# Patient Record
Sex: Female | Born: 1935 | Race: White | Hispanic: No
Health system: Southern US, Community
[De-identification: ages and names within clinical notes are randomized; demographics above are authoritative.]

---

## 1997-05-21 ENCOUNTER — Encounter: Admission: RE | Admit: 1997-05-21 | Discharge: 1997-05-21 | Payer: Self-pay | Admitting: Internal Medicine

## 1998-03-24 ENCOUNTER — Other Ambulatory Visit: Admission: RE | Admit: 1998-03-24 | Discharge: 1998-03-24 | Payer: Self-pay | Admitting: Oncology

## 1998-07-07 ENCOUNTER — Encounter: Admission: RE | Admit: 1998-07-07 | Discharge: 1998-07-07 | Payer: Self-pay | Admitting: Internal Medicine

## 1998-07-07 ENCOUNTER — Encounter: Payer: Self-pay | Admitting: Oncology

## 1998-07-11 ENCOUNTER — Ambulatory Visit (HOSPITAL_COMMUNITY): Admission: RE | Admit: 1998-07-11 | Discharge: 1998-07-11 | Payer: Self-pay | Admitting: Internal Medicine

## 1999-03-27 ENCOUNTER — Encounter: Admission: RE | Admit: 1999-03-27 | Discharge: 1999-03-27 | Payer: Self-pay | Admitting: Oncology

## 1999-03-27 ENCOUNTER — Encounter: Payer: Self-pay | Admitting: Oncology

## 2000-04-18 ENCOUNTER — Encounter: Payer: Self-pay | Admitting: Oncology

## 2000-04-18 ENCOUNTER — Encounter: Admission: RE | Admit: 2000-04-18 | Discharge: 2000-04-18 | Payer: Self-pay | Admitting: Oncology

## 2001-04-24 ENCOUNTER — Encounter: Payer: Self-pay | Admitting: Oncology

## 2001-04-24 ENCOUNTER — Encounter: Admission: RE | Admit: 2001-04-24 | Discharge: 2001-04-24 | Payer: Self-pay | Admitting: Oncology

## 2001-10-18 ENCOUNTER — Encounter: Payer: Self-pay | Admitting: Emergency Medicine

## 2001-10-18 ENCOUNTER — Emergency Department (HOSPITAL_COMMUNITY): Admission: EM | Admit: 2001-10-18 | Discharge: 2001-10-19 | Payer: Self-pay | Admitting: Emergency Medicine

## 2002-05-04 ENCOUNTER — Encounter: Admission: RE | Admit: 2002-05-04 | Discharge: 2002-05-04 | Payer: Self-pay | Admitting: Oncology

## 2002-05-04 ENCOUNTER — Encounter: Payer: Self-pay | Admitting: Oncology

## 2002-08-29 ENCOUNTER — Encounter: Payer: Self-pay | Admitting: Internal Medicine

## 2002-08-29 ENCOUNTER — Ambulatory Visit (HOSPITAL_COMMUNITY): Admission: RE | Admit: 2002-08-29 | Discharge: 2002-08-29 | Payer: Self-pay | Admitting: Internal Medicine

## 2002-11-15 ENCOUNTER — Encounter: Admission: RE | Admit: 2002-11-15 | Discharge: 2003-01-02 | Payer: Self-pay | Admitting: Neurology

## 2003-05-17 ENCOUNTER — Encounter: Admission: RE | Admit: 2003-05-17 | Discharge: 2003-05-17 | Payer: Self-pay | Admitting: Oncology

## 2003-12-27 ENCOUNTER — Ambulatory Visit: Payer: Self-pay | Admitting: Oncology

## 2004-05-18 ENCOUNTER — Encounter: Admission: RE | Admit: 2004-05-18 | Discharge: 2004-05-18 | Payer: Self-pay | Admitting: Oncology

## 2004-06-26 ENCOUNTER — Ambulatory Visit: Payer: Self-pay | Admitting: Oncology

## 2004-10-12 ENCOUNTER — Encounter: Admission: RE | Admit: 2004-10-12 | Discharge: 2004-10-12 | Payer: Self-pay | Admitting: Internal Medicine

## 2004-12-23 ENCOUNTER — Emergency Department (HOSPITAL_COMMUNITY): Admission: EM | Admit: 2004-12-23 | Discharge: 2004-12-23 | Payer: Self-pay | Admitting: Emergency Medicine

## 2005-06-25 ENCOUNTER — Ambulatory Visit: Payer: Self-pay | Admitting: Oncology

## 2005-06-25 LAB — COMPREHENSIVE METABOLIC PANEL
ALT: 10 U/L (ref 0–40)
CO2: 28 mEq/L (ref 19–32)
Chloride: 105 mEq/L (ref 96–112)
Glucose, Bld: 88 mg/dL (ref 70–99)
Potassium: 4.9 mEq/L (ref 3.5–5.3)
Sodium: 142 mEq/L (ref 135–145)
Total Bilirubin: 0.8 mg/dL (ref 0.3–1.2)

## 2005-06-25 LAB — CBC WITH DIFFERENTIAL/PLATELET
BASO%: 0.4 % (ref 0.0–2.0)
Basophils Absolute: 0 10*3/uL (ref 0.0–0.1)
EOS%: 2.1 % (ref 0.0–7.0)
HCT: 42 % (ref 34.8–46.6)
HGB: 14.1 g/dL (ref 11.6–15.9)
Platelets: 182 10*3/uL (ref 145–400)
WBC: 9 10*3/uL (ref 3.9–10.0)

## 2005-06-25 LAB — LACTATE DEHYDROGENASE: LDH: 177 U/L (ref 94–250)

## 2006-10-06 ENCOUNTER — Inpatient Hospital Stay (HOSPITAL_COMMUNITY): Admission: EM | Admit: 2006-10-06 | Discharge: 2006-10-09 | Payer: Self-pay | Admitting: Emergency Medicine

## 2006-10-06 ENCOUNTER — Ambulatory Visit: Payer: Self-pay | Admitting: Internal Medicine

## 2006-11-03 ENCOUNTER — Encounter (HOSPITAL_BASED_OUTPATIENT_CLINIC_OR_DEPARTMENT_OTHER): Admission: RE | Admit: 2006-11-03 | Discharge: 2006-11-08 | Payer: Self-pay | Admitting: Surgery

## 2006-11-11 ENCOUNTER — Inpatient Hospital Stay (HOSPITAL_COMMUNITY): Admission: AD | Admit: 2006-11-11 | Discharge: 2006-11-15 | Payer: Self-pay | Admitting: Internal Medicine

## 2006-11-14 ENCOUNTER — Encounter (INDEPENDENT_AMBULATORY_CARE_PROVIDER_SITE_OTHER): Payer: Self-pay | Admitting: Gastroenterology

## 2007-01-26 ENCOUNTER — Inpatient Hospital Stay (HOSPITAL_COMMUNITY): Admission: EM | Admit: 2007-01-26 | Discharge: 2007-02-01 | Payer: Self-pay | Admitting: Emergency Medicine

## 2007-01-26 ENCOUNTER — Ambulatory Visit: Payer: Self-pay | Admitting: Internal Medicine

## 2007-01-30 ENCOUNTER — Ambulatory Visit: Payer: Self-pay | Admitting: Vascular Surgery

## 2007-08-03 ENCOUNTER — Ambulatory Visit: Payer: Self-pay | Admitting: Oncology

## 2007-08-23 ENCOUNTER — Ambulatory Visit (HOSPITAL_COMMUNITY): Admission: RE | Admit: 2007-08-23 | Discharge: 2007-08-23 | Payer: Self-pay | Admitting: Oncology

## 2007-08-23 LAB — CBC WITH DIFFERENTIAL/PLATELET
BASO%: 0.1 % (ref 0.0–2.0)
Basophils Absolute: 0 10*3/uL (ref 0.0–0.1)
EOS%: 1.1 % (ref 0.0–7.0)
HCT: 44.2 % (ref 34.8–46.6)
HGB: 15.2 g/dL (ref 11.6–15.9)
MCV: 94.2 fL (ref 81.0–101.0)
NEUT#: 8 10*3/uL — ABNORMAL HIGH (ref 1.5–6.5)
Platelets: 207 10*3/uL (ref 145–400)
RBC: 4.69 10*6/uL (ref 3.70–5.32)
WBC: 12.4 10*3/uL — ABNORMAL HIGH (ref 3.9–10.0)
lymph#: 3.4 10*3/uL — ABNORMAL HIGH (ref 0.9–3.3)

## 2007-08-23 LAB — COMPREHENSIVE METABOLIC PANEL
Alkaline Phosphatase: 38 U/L — ABNORMAL LOW (ref 39–117)
BUN: 21 mg/dL (ref 6–23)
CO2: 19 mEq/L (ref 19–32)
Calcium: 8.9 mg/dL (ref 8.4–10.5)
Chloride: 104 mEq/L (ref 96–112)
Glucose, Bld: 162 mg/dL — ABNORMAL HIGH (ref 70–99)
Potassium: 4 mEq/L (ref 3.5–5.3)
Sodium: 138 mEq/L (ref 135–145)
Total Bilirubin: 0.9 mg/dL (ref 0.3–1.2)
Total Protein: 6.5 g/dL (ref 6.0–8.3)

## 2007-10-26 ENCOUNTER — Ambulatory Visit: Payer: Self-pay | Admitting: Oncology

## 2007-10-26 ENCOUNTER — Inpatient Hospital Stay (HOSPITAL_COMMUNITY): Admission: EM | Admit: 2007-10-26 | Discharge: 2007-11-01 | Payer: Self-pay | Admitting: Emergency Medicine

## 2007-10-29 ENCOUNTER — Ambulatory Visit: Payer: Self-pay | Admitting: Oncology

## 2008-01-15 IMAGING — CR DG ABDOMEN ACUTE W/ 1V CHEST
3 series · 3 of 3 positions shown · non-contrast
Comparison: Chest dated 10/06/2006.

CLINICAL DATA: Nausea and vomiting.

ABDOMEN SERIES - 2 VIEW & CHEST - 1 VIEW

[w chest pa]
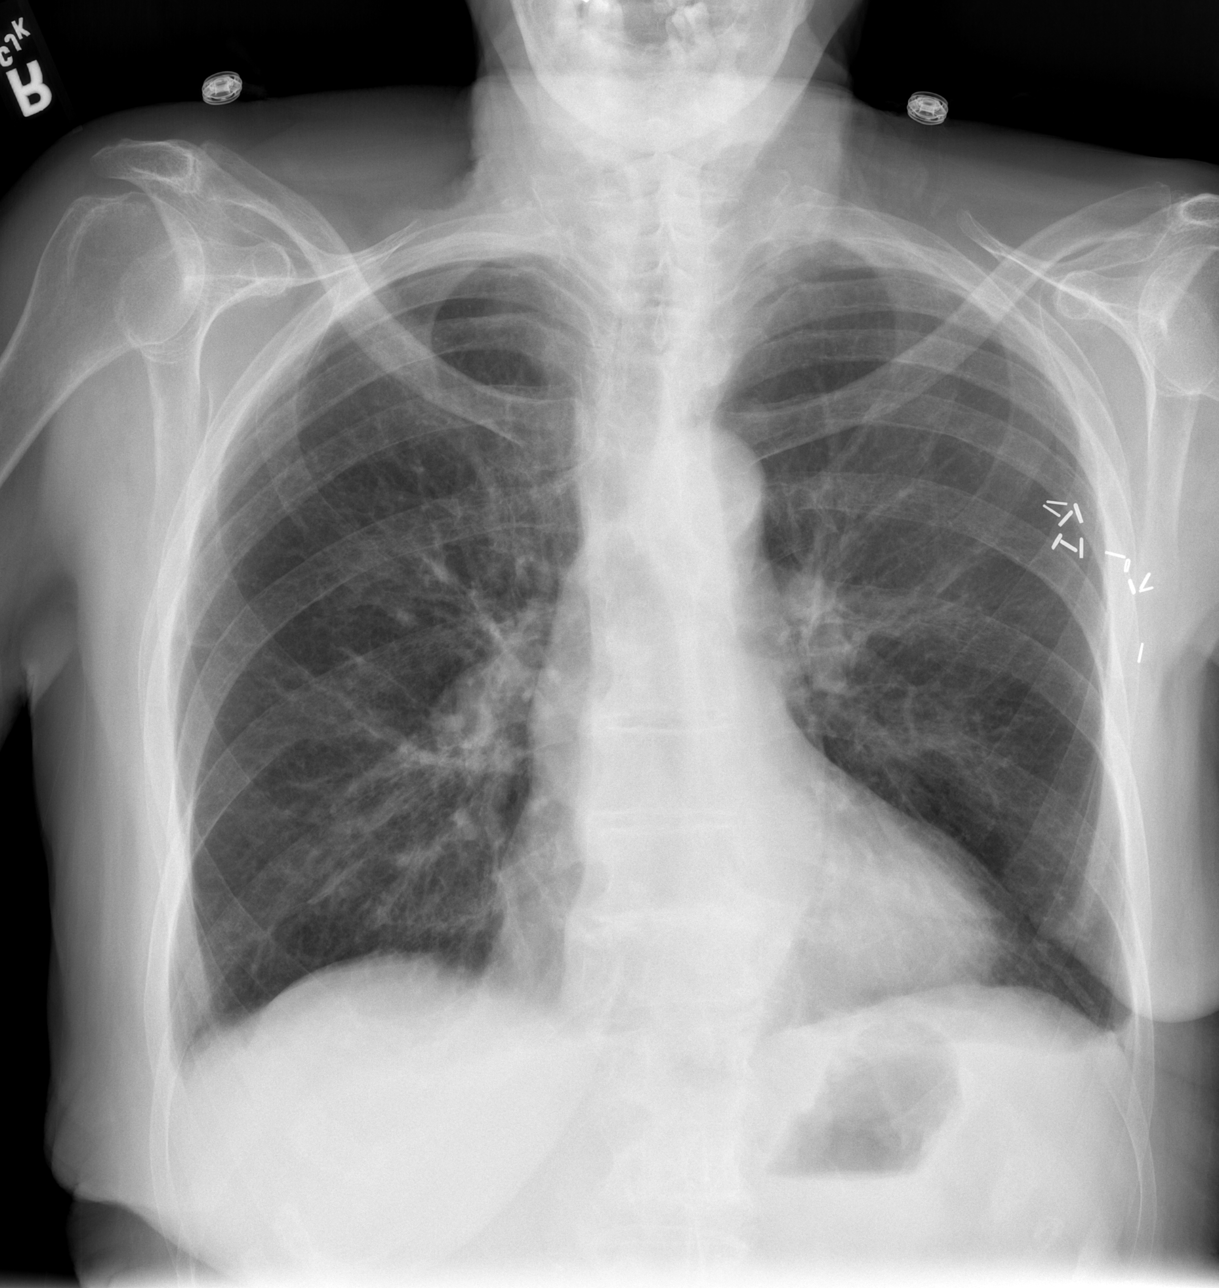

[w abdomen upright]
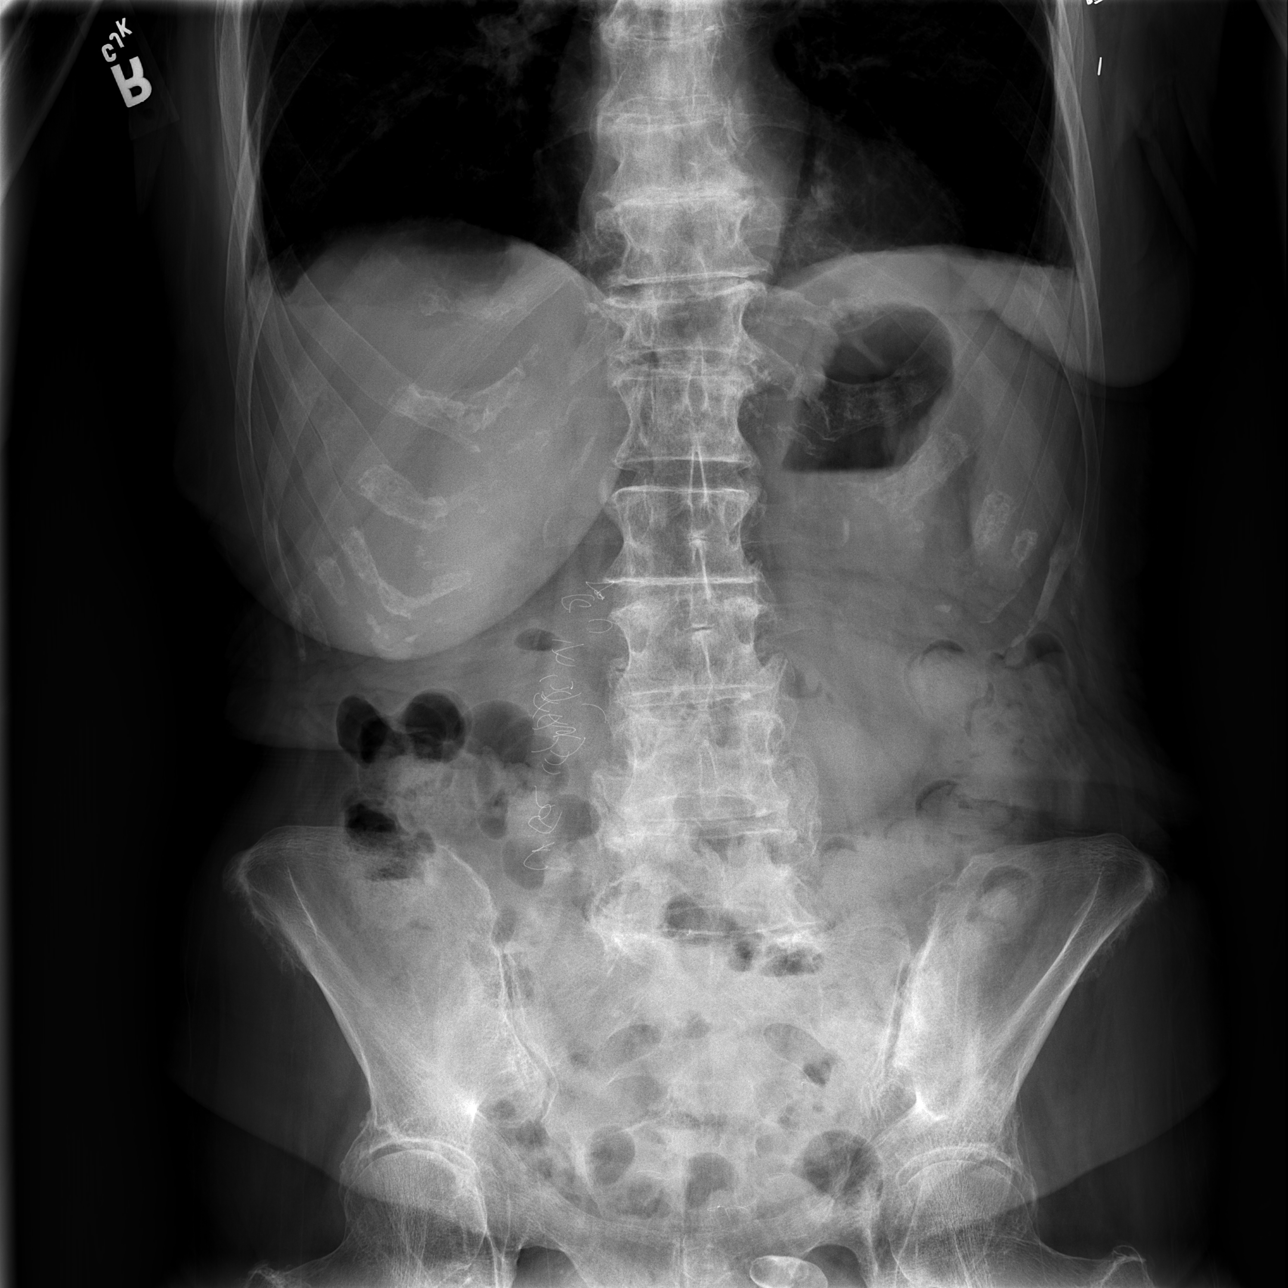

[t abdomen supine]
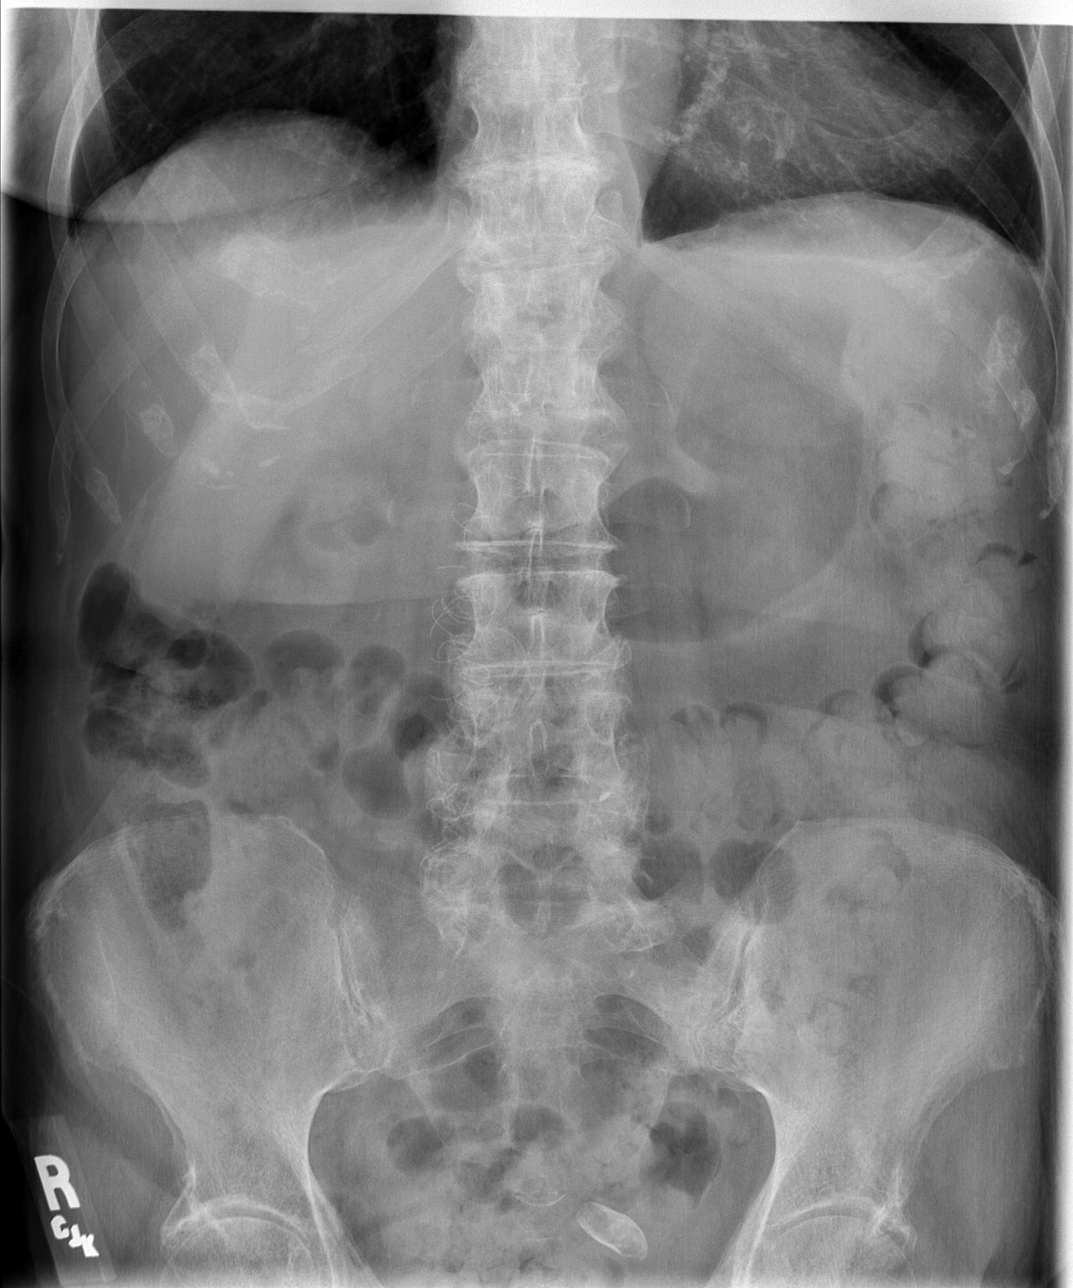

[3 of 3 positions shown; findings below may reference images not displayed]

FINDINGS: Stable borderline enlarged cardiac silhouette, left axillary surgical
clips, left postmastectomy changes and mild changes of COPD and chronic
bronchitis. Minimal bilateral pleural thickening or fluid. Normal bowel gas
pattern without free peritoneal air. Lumbar and thoracic spine degenerative
changes. Right abdominal wire sutures. Left pelvic calcification, with a similar
calcification located in the right pelvis on 12/23/2004.

IMPRESSION

1. Probable bladder calculus.
2. Stable borderline cardiomegaly and mild changes of COPD and chronic
bronchitis.
3. Minimal bilateral pleural thickening or fluid.

## 2010-03-08 ENCOUNTER — Encounter: Payer: Self-pay | Admitting: Oncology

## 2010-03-08 ENCOUNTER — Encounter: Payer: Self-pay | Admitting: Internal Medicine

## 2010-06-30 NOTE — H&P (Signed)
[name], [name]             ACCOUNT NO.:  [id number]   MEDICAL RECORD NO.:  [id number]          PATIENT TYPE:  INP   LOCATION:  5128                         FACILITY:  [facility]   PHYSICIAN:  Lonia Blood, M.D.DATE OF BIRTH:  1916-04-21   DATE OF ADMISSION:  10/25/2007  DATE OF DISCHARGE:                              HISTORY & PHYSICAL   PRIMARY CARE PHYSICIAN:  Unassigned/[facility].   ONCOLOGIST:  Genene Churn. Cyndie Chime, M.D.   CHIEF COMPLAINT:  Failure to thrive.   HISTORY OF PRESENT ILLNESS:  Ms. [name] was a very pleasant 77-  year-old female with history of Alzheimer's-type dementia, who lives in  [place] with her daughter.  She has a significant history of a left  breast cancer, status post mastectomy and chemotherapy in the distant  past.  She has recently suffered a local recurrence with a small  cutaneous lesion, and is being followed by Dr. Cyndie Chime for this.   She presented to the emergency room tonight with complaints of severe  generalized failure to thrive for approximately 2 weeks now.  The  patient's daughter states that it has become more and more difficult to  get her to eat or drink.  She states that she has consistently taken in,  at least some volume of liquids, but that especially over the last 3-4  days her intake of solids has been exceedingly limited.  As her intake  of liquids has decreased over the last 24 hours, the patient's daughter  became concerned that she would become severely dehydrated; and  therefore presented with her to the emergency room.   In the emergency room a chest x-ray was obtained, which revealed a lower  lobe likely on the right air space disease, consistent with pneumonia;  as well as scattered areas of bony sclerosis.  The patient was also  found to be clinically dehydrated.   At the present time the patient is resting comfortably on a hospital  stretcher.  She is alert though lethargic.   She was able to answer my  questions, but denies any complaints whatsoever.  She clearly does not  grasp the current situation and is somewhat confused.   REVIEW OF SYSTEMS:  Comprehensive review of systems cannot be  accomplished, secondary to the patient's dementia.  The patient's  daughter does state that she was treated for a urinary tract infection  approximately 2 weeks ago, and had a normal chest x-ray at that time in  regard to pneumonia.   PAST MEDICAL HISTORY:  1. Hypertension.  2. Alzheimer's-type dementia.  3. History of left breast cancer, status post a left-sided mastectomy      with chemotherapy; with local recurrence recently established with      Dr. Cyndie Chime.  4. Hiatal hernia.  5. History of left hemispheric CVA x2, with punctate lesions in the      posterior frontal and parietal cortex December 2008.  6. Remote history of dysphagia.  7. NO CODE BLUE STATUS/DNR.   MEDICATIONS:  1. Norvasc 5 mg daily.  2. Aspirin 81 mg daily.  3. Lopressor 25 mg  at bedtime.  4. Tamoxifen 20 mg daily.  5. Prilosec daily.   ALLERGIES:  SULFA.   FAMILY HISTORY:  Noncontributory, secondary to advanced age.   SOCIAL HISTORY:  The patient does not smoke.  She does not drink.  She  does not use illicit drugs.  She is a widow.  She lives in [place]  with her daughter.   DATA REVIEW:  CBC is unremarkable, with an MCV that is borderline-  elevated at 98.  BMET is unremarkable, with exception of BUN elevated at  24, with a creatinine of 1.0 and an elevated serum glucose of 128.  Urinalysis reveals moderate leukocyte esterase and 3-6 white blood  cells.  Chest x-ray reveals a posterior lower lobe air space disease,  consistent with pneumonia and scattered areas of bony sclerosis.  A CT  scan of the head reveals no acute disease, but evidence of cerebral  atrophy and microvascular disease.   PHYSICAL EXAMINATION:  Temperature 96.9, blood pressure 129/81, heart  rate 102,  respiratory rate 12.  GENERAL:  A well-developed well-nourished female who is somewhat  lethargic, but pleasant.  LUNGS:  Clear to auscultation bilaterally,  with exception to mild bibasilar crackles without wheezes.  CARDIOVASCULAR:  Distant regular 2/6 holosystolic murmur.  ABDOMEN:  Soft, somewhat overweight, no rebound, no ascites.  No  organomegaly.  Bowel sounds are positive and nondistended.  EXTREMITIES:  Trace bilateral lower extremity edema without cyanosis or  clubbing.  NEUROLOGIC:  The patient is pleasant but is confused.  She is lethargic  and attempts to sleep during the exam.  She moves all four extremities  spontaneously.   IMPRESSION AND PLAN:  1. Right lower lobe pneumonia:  We must consider the possibility of      this being an aspiration, especially in the patient who is severely      weak, confused related to her dementia, and has a history of      dysphagia.  I will keep her n.p.o. except for her medications.  I      will ask for speech and language pathology to evaluate her.  She      would not be an appropriate candidate for a PEG tube, but if we can      adjust her diet somewhat and decrease her chance of aspiration,      this would be reasonable intervention.  2. Probable recurrent breast cancer:  Dr. Cyndie Chime has apparently      been contacted by the emergency room and is scheduled to follow-up      with the patient during the hospital stay.  We will continue her      tamoxifen and leave further evaluation to her oncologist.  3. Dehydration:  This appears to be a failure to thrive picture,      unlikely due to generalized weakness related to the patient's      pneumonia.  We will gently hydrate the patient.  We will      empirically treat the patient with Zosyn for the possibility of      aspiration.  4. Hypertension:  We will continue the patient's beta blocker and      Norvasc.  We will hydrate her gently.  We will follow her blood      pressure  trend.  5. No code blue/DNR.   I have discussed the patient's wishes with the patient's daughter who is  her primary caretaker.  She confirms that the patient would not wish  to  undergo intubation, mechanical ventilation or CPR.  I will therefore  declare her a no code blue during her hospital stay.      Lonia Blood, M.D.  Electronically Signed     JTM/MEDQ  D:  10/26/2007  T:  10/26/2007  Job:  [id number]

## 2010-06-30 NOTE — Consult Note (Signed)
NAMETELESIA, [name]             ACCOUNT NO.:  [id number]   MEDICAL RECORD NO.:  [id number]          PATIENT TYPE:  INP   LOCATION:  6715                         FACILITY:  [facility]   PHYSICIAN:  Bernette Redbird, M.D.   DATE OF BIRTH:  July 27, 1916   DATE OF CONSULTATION:  11/12/2006  DATE OF DISCHARGE:                                 CONSULTATION   Dr. Valentina Lucks asked [place] to see this 77 year old female because of  diminished food intake.   The history is obtained primarily from Dr. Valentina Lucks and from the  patient's daughter, Gery Pray, who is at the bedside.  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   Basically, Mrs. Mcnall lives at home with the help of her daughter.  She  has not driven for years, gets around with the help of a walker, is  somewhat frail.  With that background, she has been losing weight for  the past month or so and has had markedly diminished food intake,  most  noticeable over the past couple of weeks where she would eat only a  couple of bites of food.  She also has periodic episodes, not  necessarily postprandially, where she will be having sort of retching  and heating, without actual vomiting.  She might get up some phlegm.  There is a complaint of some nausea.  She is not on any ulcerogenic  medications and does not have a prior history of GI problems.   Ironically, this morning, she ate quite a large breakfast, whereas in  recent weeks, there has been fairly consistent poor food intake.   ALLERGIES:  No known allergies.   OUTPATIENT MEDICATIONS:  Norvasc 5 mg daily.   PAST SURGICAL HISTORY:  1. Hysterectomy.  2. Mastectomy.   MEDICAL ILLNESSES:  1. Hypertension.  2. Breast cancer.   FAMILY HISTORY:  Not obtained, not felt to be relevant.   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  As noted, she is assisted heavily by her daughter who lives at home with  her.   REVIEW OF SYSTEMS:  Negative  for any obvious bowel complaints recently.  Positive for HPI for upper tract symptoms including anorexia, early  satiety, weight loss, and upper tract retching.   PHYSICAL EXAMINATION:   REVIEW OF SYSTEMS:  The patient has some swallowing difficulties and has  a chronically mediocre appetite.  She eats a mostly soft diet but can  eat small amounts of meat.  No recent change in appetite.  No chronic  abdominal pain.  No dyspepsia.  No problem with constipation or recent  dark stools.   PHYSICAL EXAMINATION:  GENERAL:  An alert, awake, pleasant but minimally  talkative Caucasian female in no acute distress.  HEENT:  She is anicteric without frank pallor.  CHEST:  Grossly clear and there is no evident respiratory distress.  HEART:  Regular rhythm with prominent 3/6 soft blowing murmur at the  upper left sternal border.  ABDOMEN:  Without organomegaly, guarding, mass or tenderness.  There is  some rather diffuse  fullness and mild distention of the lower abdomen  without frank meteorism,  without any palpable stool.  RECTAL:  Shows moderate amount of scybalous stool in the rectal ampulla,  without frank fecal impaction.  I do not appreciate any rectal masses.   LABORATORY DATA:  White count 10,500, hemoglobin 14.2, MCV 92, platelets  294,000.  INR 1.  Chemistry panel essentially normal except for  nonfasting glucose of 117, minimal elevation of alk phos at 135 and  albumin of 3.3.  Urinalysis is clear.   IMPRESSION:  1. Recent change in food intake, with apparent anorexia and/or early      satiety and/or food avoidance.  2. Periodic retching without frank vomiting, not necessarily      postprandial.  3. Possible tendency toward constipation by examination.  4. Organic brain syndrome.  5. Hypertension.  6. History of breast cancer approximately 10 years ago.   PLAN:  1. Calorie counts.  2. Endoscopic evaluation on Monday (nature, purpose and risks reviewed      with the patient's  daughter and she prefers this exam over      attempting an upper GI series or watchful waiting)  3. Mild laxation.   DISCUSSION:  It is unclear whether or not the patient's poor intake is  due to her organic brain syndrome or whether there is an anatomic  organic pathologic process to account for her problem with poor intake  and retching.  Endoscopic evaluation should be helpful in sorting out  the different possibilities.   1. Calorie counts.  2. Endoscopic evaluation on Monday (nature, purpose and risks reviewed      with the patient's daughter and she prefers this exam over      attempting an upper GI series or watchful waiting)  3. Mild laxation.           ______________________________  Bernette Redbird, M.D.     RB/MEDQ  D:  11/12/2006  T:  11/13/2006  Job:  [id number]   cc:   Flora Lipps, M.D.

## 2010-06-30 NOTE — Discharge Summary (Signed)
NAMELAIA, WILEY             ACCOUNT NO.:  [id number]   MEDICAL RECORD NO.:  [id number]          PATIENT TYPE:  INP   LOCATION:  6715                         FACILITY:  [facility]   PHYSICIAN:  Barnetta Chapel, MDDATE OF BIRTH:  [birth year]   DATE OF ADMISSION:  11/11/2006  DATE OF DISCHARGE:  11/14/2006                               DISCHARGE SUMMARY   PRIMARY CARE Farris Geiman:  Dr. Particia Jasper.   DISCHARGE DIAGNOSES:  1. Anorexia.  2. Weight loss.  3. Failure to thrive.  4. Dysphagia.  5. Mild gastritis.  6. Hiatal hernia.  7. Shallow duodenal ulcer.  8. Suspect mild dementia.   DISCHARGE MEDICATIONS:  1. Norvasc 5 mg p.o. daily.  2. Aricept 5 mg p.o. nightly.  3. Protonix 40 mg twice daily.  4. Megace 400 mg twice daily.  5. MiraLax 17 gm daily for one week then p.r.n.  6. Resource peach liquid 240 mL p.o. b.i.d.   CONSULTATIONS:  GI consult.   PROCEDURE:  EGD.  The patient had an EGD done by Dr. Madilyn Fireman.  The EGD  revealed hiatal hernia, shallow duodenal ulcer, and mild gastritis.  Samples for biopsy were taken and the result is still pending.  The  primary care Angenette Daily and the GI team should please follow up the biopsy  results.   BRIEF HISTORY AND HOSPITAL COURSE:  The patient is an 77 year old female  with past medical history significant for hypertension, mild dementia,  UTI, and left breast cancer.  The patient was admitted with anorexia,  dysphagia, and failure to thrive.   The patient was admitted to the regular medical floor.  The patient was  started on Megace for poor appetite.  She was adequately rehydrated.  A  dietician and GI consults were called.  The patient has improved  significantly and will be discharged back home today to the care of her  primary care Kolston Lacount.  The primary care Roanne Haye and the GI physicians  should please follow the biopsy results.   DISCHARGE PLAN:  1. Discharge the patient home today.  2. Follow up with the primary care  Aldona Bryner, Dr. Lyla Son in a week.  3. Follow up with the GI physician (Dr. Matthias Hughs).  4. Primary care Daniel Johndrow and the GI team to follow the biopsy results.  5. Activity as tolerated.  6. Cardiac diet.      Barnetta Chapel, MD  Electronically Signed     SIO/MEDQ  D:  11/14/2006  T:  11/14/2006  Job:  [id number]   cc:   Bernette Redbird, M.D.

## 2010-06-30 NOTE — Consult Note (Signed)
[name], [name]             ACCOUNT NO.:  [id number]   MEDICAL RECORD NO.:  [id number]          PATIENT TYPE:  INP   LOCATION:  4702                         FACILITY:  [facility]   PHYSICIAN:  Darnelle Bos, MDDATE OF BIRTH:  10-Aug-1916   DATE OF CONSULTATION:  01/28/2007  DATE OF DISCHARGE:                                 CONSULTATION   Neurology Consultation for [facility] for Dr. Nash Shearer.   REFERRING PHYSICIAN:  Consultation requested by Teaching Service, Alvester Morin, M.D.   REASON FOR CONSULTATION:  Altered mental status and abnormal MRI showing  acute strokes.   HISTORY AND FINDINGS:  The history was obtained from the charts and the  medical records as no family was available, at this time, and the  patient was unable to provide further history.   This is an 77 year old female who was admitted to the hospital on  December 11 for failure to thrive and altered mental status.  She was  thought to have a pneumonia, by her primary care physician and was  started on antibiotics.  As the patient was not getting better, and was  having more increased shortness of breath, and was unable to ambulate  she was brought to the emergency room.  There was also a recent history  of UTI, which was treated with 7 days of ciprofloxacin.   In the hospital, the patient was noted to be confused and delirious on  December 12 with hallucinations.  Later in the night, she was noted to  be unresponsive requiring sternal rub to wake up, but was waking up  minimally and moving all four extremities.  At that time a CT of the  head was obtained to evaluate for the reason for the change in mental  status which was negative.  Since she continued to be more somnolent, an  MRI of the brain was ordered, which showed new infarcts in the left  posterior frontal and parietal regions which were very tiny, measuring  less than 4 mm.  The neurology consultation was obtained for this  reason.   MEDICAL HISTORY:  1. Hypertension.  2. Anorexia.  3. Weight loss.  4. Failure to thrive.  5. Dysphasia.  6. Mild gastritis.  7. Hiatal hernia.  8. Shallow duodenal ulcer.  9. Mild dementia.  10.Left breast cancer status post mastectomy and chemotherapy in 1998.   ALLERGIES:  None.   MEDICATIONS IN THE HOSPITAL INCLUDE:  1. Ceftriaxone 1 g once a day.  2. Aricept 5 mg at bedtime.  3. Lovenox 40 mg subcu nightly.  4. Megace 40 mg p.o. b.i.d.  5. Avelox 400 mg IV daily.  6. Protonix 40 mg p.o. daily.  7. Simvastatin 40 mg p.o. daily.  8. Atrovent 0.5 mg q.3 h. p.r.n.  9. Xopenex 0.63 mg inhalation q.3 h. p.r.n.  10.She was also started, today, on aspirin 325 mg p.o. daily.  11.At home, prior to today, she was not on any antiplatelet agents.   SOCIAL HISTORY:  No history of tobacco or alcohol use, cocaine or drugs.   FAMILY HISTORY:  She is widowed and  lives with her daughter.  Family  history noncontributory per records.   REVIEW OF SYSTEMS:  Currently the patient denies any difficulties in all  the 13 systems that were reviewed.   PHYSICAL EXAMINATION:  VITALS:  Her blood pressure has varied between 97-  117 systolic/63-90 diastolic.  T-max was 98.  Heart rate 103 to 111,  respiratory rate 18 to 20.  She is now saturating 97% on 2 L.  GENERAL:  On examination Ms. Burges is a pleasant female lying in the  bed.  HEENT:  Normocephalic, atraumatic.  NECK:  Supple, no carotid bruits, no thyromegaly.  CARDIOVASCULAR:  Regularly irregular heart rate with distant heart  sounds.  LUNGS:  Clear, but distant.  ABDOMEN:  Soft.  EXTREMITIES:  No signs of clubbing or edema.  NEUROLOGIC:  The patient was awake, alert.  She was oriented to person.  She could tell me the date and the month, but not the year.  She could  not identify where she was, but knew that she was in [place].  She  was oriented to herself, but cannot tell me her age or date of birth  currently.   She could name and repeat without any problems.  No  dysarthria or dysphasia was noted.  Cranial nerves II-XII were equal.  Pupils were equal and reactive to light and accommodation.  Extraocular  movements were intact.  Visual fields were intact to threat.  No eye  closure or weakness.  There was mild flattening of the right nasolabial  fold.  Tongue was midline without any atrophy or weakness.  Facial  sensation was intact.   Motor evaluation:  She is a frail, female.  Her tone evaluation revealed  some paratonia diffusely, but questionable cogwheeling on the left upper  extremity.  Strength was age-appropriate at 5-/5 in all four extremities  with no pronator drift, or no drift of the legs.  Upper extremities:  Deep tendon reflexes were trace, in the lower extremities it was absent.  Plantars were upgoing bilaterally.  Sensation was grossly intact to  touch, but the patient could not cooperate for testing other modalities  of sensation.  Finger-nose testing normal.  The patient could not  perform any heel testing, and gait was not evaluated at this time.   LABORATORY EVALUATION:  MRI of the brain was reviewed.  The diffusion  rate abnormality does show a bright lesion in the right posterior  frontal and parietal regions.  The lesion in the parietal region  definitely shows a dropout on the ADC on the images.  The vasculature  appeared intact in the MRI images.  No MRA was done.   Her labs were reviewed, these show normal troponin of 0.05, creatinine  of 161, with CK/MB of 2, and elevated next troponin of 0.2.  CBC showed  a hemoglobin of 13.2, hematocrit of 39.2.  WBC was slightly elevated at  10.6, platelets were 223.  Her TSH level was normal at 1.3.  Her blood  cultures have not grown anything to date.  Her chest x-ray showed right  lower lobe infiltrate.   IMPRESSION:  1. Acute cerebrovascular accident with 2 punctate areas of infarct in      the left hemisphere.  Looking at  the size and the size of the      affected area it is unlikely that her altered mental status is a      result these; and the more likely reason for her altered mental  status is the  underlying dementia with  pneumonia and being      hospitalized in a new environment.  For the cerebrovascular      accident, though, we recommend an aspirin for stroke prophylaxis,      which has already been started.  2. Stroke evaluation including carotid Dopplers and transthoracic      echocardiogram and fasting lipid profile.  Though it is unclear if      her management will change, but would like to look for any cardiac      source of the stroke or a large vessel disease.   RECOMMENDATIONS:  1. Will recommend speech evaluation and the patient will be held NPO      until evaluated by speech for swallowing.  2. Recommend physical therapy and occupational therapy.  3. Recommend maintaining systolic blood pressure less than 180 mmHg.  4. Recommend continuing DVT prophylaxis.  5. Also recommend continuing telemetry and monitoring to look for any      atrial fibrillation.  6. Altered mental status, likely delirium secondary to pneumonia with      underlying dementia.  Agree with continuing treatment of the      underlying cause and avoiding medications that could worsen her      delirium especially sedatives and narcotics.   This patient will be seen by Dr. Nash Shearer from [facility] in the  morning, but if you have any questions or concerns, please do not  hesitate to contact [facility].  Thank you for asking [place]  to evaluate Ms. Lahman.   Reviwe of her chart showed transient episode of strial fibrillation. d/w  primary team. She will ideally need antocagluation but due her risk of  bleeding atleast ASA shoud be started      Darnelle Bos, MD  Electronically Signed     RB/MEDQ  D:  01/28/2007  T:  01/30/2007  Job:  [id number]

## 2010-06-30 NOTE — Discharge Summary (Signed)
[name], [name]             ACCOUNT NO.:  [id number]   MEDICAL RECORD NO.:  [id number]          PATIENT TYPE:  INP   LOCATION:  6715                         FACILITY:  [facility]   PHYSICIAN:  Barnetta Chapel, MDDATE OF BIRTH:  [birth year]   DATE OF ADMISSION:  11/11/2006  DATE OF DISCHARGE:  11/15/2006                               DISCHARGE SUMMARY   ADDENDUM:  The patient was initially discharged on November 14, 2006.  However, the patient did not eventually go home as the patient's  daughter did not feel comfortable taking the patient home.  The patient  will be discharged home today, November 15, 2006.  The patient was said  to have had episodes of loose bowel.  It was noted that the patient was  taking MiraLax 17 g once daily.  MiraLax has been held.  Today, the  patient has only had one episode of loose bowel.  The family feels  comfortable taking the patient home today.  The CLOtest done was  negative for urease.   DISCHARGE PLAN:  1. Discharge patient home today.  2. Hold MiraLax.  3. Continue the discharge medications.  4. Follow previous discharge plans.      Barnetta Chapel, MD  Electronically Signed     SIO/MEDQ  D:  11/15/2006  T:  11/15/2006  Job:  [id number]

## 2010-06-30 NOTE — Assessment & Plan Note (Signed)
[facility]   NAMEINZA, [name]             ACCOUNT NO.:  [id number]   MEDICAL RECORD NO.:  [id number]      DATE OF BIRTH:  10-Oct-1916   PHYSICIAN:  Maxwell Caul, M.D. VISIT DATE:  11/04/2006                                   OFFICE VISIT   Mrs. [name] is an 77 year old lady who was accompanied by her daughter  who provided most of the history.  She lives with her daughter and for  the most part is doing fairly well.  She is referred for review of an  eschar on her right shin and she is here for our review of this area.   PAST MEDICAL HISTORY:  1. Breast cancer with status post mastectomy in 1995.  2. Hysterectomy.  3. Osteoarthritis.  4. Remote history of tuberculosis.  5. History of hypertension.   PHYSICAL EXAM RELATED TO THE PROBLEM AREA:  Roughly in her mid tibial  area this woman has a considerable bony irregularity.  Initially I  thought this was perhaps a fracture and intensely questioned her on  this.  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  She recovered from this and has  never really given it any other thought.  However there is a palpable  abnormality underneath the problem area.  The bone is very irregular  feeling however there is no tenderness, no drainage and no discharge.  I  did remove the eschar and there is nothing but epithelialized skin  underneath this again without evidence of infection.   IMPRESSION:  Probable area of skin damage associated with cortical bone  irregularity in the right tibia.  Miraculously this area has not caused  this patient more difficulty over the years.  The skin over this is very  thin, should she could take a direct blow on this area is think she  would develop a wound that would be extremely difficult to heal.  Right  now she does not have any open area under the eschar that was removed.  There is no drainage and no evidence of an  infection.  The area was  apparently x-rayed a year ago by her primary physician, although those  results were not known to the patient's daughter.   WOUND CARE PLAN AND FOLLOW-UP:  I do not have any specific  recommendations here.  There is no wound under this eschar, only very  fragile looking skin/epithelialization.  The patient tells me she has  never had a wound in this area that she can remember although I found it  somewhat difficult to believe.  Nevertheless, she seems to have lived  with this properly for most of her life.  We have not made firm  arrangements to see her back however, I would be glad to do so should  she develop a further problem in this area.          ______________________________  Maxwell Caul, M.D.    MGR/MEDQ  D:  11/04/2006  T:  11/04/2006  Job:  [id number]   cc:   [name]

## 2010-06-30 NOTE — Discharge Summary (Signed)
NAMEBRIENNE, LIGUORI             ACCOUNT NO.:  [id number]   MEDICAL RECORD NO.:  [id number]          PATIENT TYPE:  INP   LOCATION:  5731                         FACILITY:  [facility]   PHYSICIAN:  Corinna L. Lendell Caprice, MDDATE OF BIRTH:  1916-09-08   DATE OF ADMISSION:  10/06/2006  DATE OF DISCHARGE:  10/09/2006                               DISCHARGE SUMMARY   DISCHARGE DIAGNOSES:  1. Urinary tract infection.  2..  Weakness and fall.  1. 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  2. Hypertension.  3. Systolic murmur, details unclear but reportedly had an      echocardiogram within the last several years.  4. Mild rhabdomyolysis.   DISCHARGE MEDICATIONS:  She is to stop prazosin, stop Bactrim.  Instead,  she will take:  1. Norvasc 5 mg a day.  2. Ciprofloxacin 500 mg p.o. b.i.d. until gone.   FOLLOWUP:  Dr.  Particia Jasper next week to monitor blood pressure and  progress with respect to deconditioning and urinary tract infection.   CONSULTATIONS:  None.   PROCEDURES:  None.   DIET:  Regular.   ACTIVITIES:  Fall precautions, 24-hour supervision.  She is to use a  walker. Home health physical therapy, occupational therapy and nurse  being arranged.  A three-in-one commode will be sent home with the  patient   PERTINENT LABORATORY DATA:  Urine culture negative. Blood cultures  negative to date.  CBC on admission significant for white blood cell  count of 11,000 with a normal differential, otherwise normal CBC.  On  August 22, the day after admission, her white blood cell count was 9000.  Basic metabolic panel on admission significant for sodium of 132. Her  sodium on August 22 was 136, otherwise unremarkable basic metabolic  panel. Total CPK on admission was 339. The day after admission, it was  262.  Urinalysis showed a specific gravity of 1.012,  pH 6.5, negative  glucose, negative bilirubin, negative ketone, negative nitrite, negative  leukocyte esterase;  however, the patient had been on Bactrim as an  outpatient and did have a urinalysis in the office which was indicative  of urinary tract infection.   SPECIAL STUDIES IN RADIOLOGY:  CT of the brain showed nothing acute.  There was atrophy and chronic small vessel disease.   Chest x-ray portable showed vascular clips in the left axilla, somewhat  coarse bronchioalveolar markers, heart size upper limits of normal,  mildly tortuous atheromatous thoracic aorta, nothing acute.   HISTORY AND HOSPITAL COURSE:  Ms. Hastings is an 77 year old white female  patient of Dr. Particia Jasper who was brought to the emergency room by family  members with fever, weakness.  She had fallen and was unable to  ambulate. Apparently she usually uses a walker.  She had been diagnosed  with a urinary tract infection several days prior to admission and had  been on Bactrim.  She was febrile in the emergency room with a  temperature of 101.3. Heart rate was 110 but otherwise unremarkable  vital signs..  Please see H&P for complete admission details.  She had  moist mucous membranes, a  3/6 systolic murmur, status post left  mastectomy, otherwise unremarkable examination.  The patient was  admitted for UTI and suspected sepsis. To date, cultures are negative,  but she had been on antibiotics as an outpatient.  She also had a  negative urinalysis in hospital. She was started on Rocephin, IV fluids.  She was noted to have a slightly elevated CPK which was felt to be  secondary to mild rhabdomyolysis.   She had multiple bruises but no deformities or other signs of fracture.  Physical therapy and occupational therapy were ordered. Physical therapy  has worked with the patient and recommended home physical therapy and a  three-in-one commode.  I also have recommended to family 24-hour  supervision and fall precautions.  I have also asked that a home nurse  and home occupational therapist be arranged as well.  Her weakness  did  improve, and she was able to ambulate well with a walker.  She did have  periods of confusion, worse at night.  She was noted to try and get out  of bed by herself and hold out her Foley catheter with the balloon  inflated.   The patient's orthostatics were tested, but this was after 24 hours of  IV hydration.  She had no drop in her blood pressure, but her heart rate  did rise 15 points.  Due to the fall, I stopped her Prevacid and instead  started metoprolol. She has not been on telemetry, and her heart rate  went from the 90s down into the 60s, and I have changed her discharge  blood pressure medication to Norvasc to avoid any possible bradycardia.  Please note that she did not have any bradycardia noted during this  hospitalization, however.  Her blood pressure on the day of discharge is  150/73, and her heart rate is currently 80.  As she has had periods of  confusion, I feel it is best at discharge her home today. I think she  will do better at home as long as someone is available 24 hours for  supervision.Marland Kitchen   She was noted to have a systolic murmur.  According to daughter, she had  an echocardiogram within the past several years.  This may need to be  followed up as an outpatient.  I do not, however, believe it is  contributing to her symptoms   The patient was noted to be in mild rhabdomyolysis and received IV  fluids.      Corinna L. Lendell Caprice, MD  Electronically Signed     CLS/MEDQ  D:  10/09/2006  T:  10/09/2006  Job:  [id number]   cc:   Bess Kinds, MD

## 2010-06-30 NOTE — Op Note (Signed)
[name], [name]             ACCOUNT NO.:  [id number]   MEDICAL RECORD NO.:  [id number]          PATIENT TYPE:  INP   LOCATION:  6715                         FACILITY:  [facility]   PHYSICIAN:  John C. Madilyn Fireman, M.D.    DATE OF BIRTH:  1916/08/25   DATE OF PROCEDURE:  DATE OF DISCHARGE:                               OPERATIVE REPORT   OPERATION/PROCEDURE:  Esophagogastroduodenoscopy with biopsy.   INDICATIONS FOR PROCEDURE:  The failure to thrive, weight loss and  decreased p.o. intake in an elderly patient.   PROCEDURE:  The patient was placed in the left lateral decubitus  position and placed on the pulse monitor with continuous low-flow oxygen  delivered by nasal cannula.  She was sedated with 25 mcg IV fentanyl and  4 mg IV Versed.  The Olympus video endoscope was advanced under direct  vision into the oropharynx and esophagus.  The esophagus was straight  and of normal caliber.  Squamocolumnar line at 38 cm.  There was a small  hiatal hernia but no ring stricture or other abnormality at GE junction.  The stomach was entered and a small amount of liquid secretions were  suctioned from the fundus.  Retroflexed view of cardia was unremarkable.  The fundus and body appeared normal.  The antrum showed some mild  erythema and granularity and edema with no focal abnormalities.  The  pylorus was slightly tight.  I did not appreciate any stenosis or  stricturing.  A CLO-test was obtained from antral mucosa.  Within the  duodenal bulb there was a shallow ulcer with no stigma of hemorrhage.  The postbulbar duodenum appeared normal.  Biopsies were taken to rule  out celiac disease.  Scope was then withdrawn and the patient returned  to the recovery room in stable condition.  She tolerated the procedure  well.  There were no immediate complications.   IMPRESSION:  1. Shallow duodenal ulcer.  2. Small hiatal hernia.  3. Mild gastritis.   PLAN:  Await biopsy results.  Will treat with  proton pump inhibitor.           ______________________________  Everardo All. Madilyn Fireman, M.D.     JCH/MEDQ  D:  11/14/2006  T:  11/14/2006  Job:  [id number]   cc:   Barnetta Chapel, MD

## 2010-06-30 NOTE — H&P (Signed)
[name], [name]             ACCOUNT NO.:  [id number]   MEDICAL RECORD NO.:  [id number]          PATIENT TYPE:  INP   LOCATION:  5731                         FACILITY:  [facility]   PHYSICIAN:  Gordy Savers, MDDATE OF BIRTH:  1916/08/30   DATE OF ADMISSION:  10/06/2006  DATE OF DISCHARGE:                              HISTORY & PHYSICAL   CHIEF COMPLAINT:  Fever, weakness.   HISTORY OF PRESENT ILLNESS:  The patient is an 77 year old white female  who enjoys reasonably good health.  She was stable until approximately 2-  3 days prior to admission when she developed fever and weakness.  She  was evaluated and treated by her primary care physician who placed her  on Bactrim DS for urinary tract infection. On the day of admission, the  patient developed progressive weakness and fever and was found on the  floor unable to ambulate. Due to her profound weakness and inability to  ambulate, the patient was brought to the emergency department for  evaluation.  She was noted to be febrile with temperature of 101.3 with  a resting tachycardia of 110.  White count was mildly elevated at 11.4.  The patient is now admitted for further evaluation and treatment of her  acute febrile illness secondary to suspected early urosepsis.   PAST MEDICAL HISTORY:  1. The patient has a long history of treated hypertension.  2. She underwent a left mastectomy approximately 12-15 years ago for      left breast cancer.  3. She has a history of systolic murmur, details unclear.   PRESENT MEDICAL REGIMEN:  Includes prazosin and Bactrim DS   SOCIAL HISTORY:  She has a daughter who lives by and grandson who lives  with the patient.  She is widowed, nondrinker, nonsmoker   FAMILY HISTORY:  Details are unclear.   The patient has a right upper quadrant scar, details unknown.   PHYSICAL EXAMINATION:  GENERAL:  Examination revealed a well-developed,  elderly, thin, white female who is mildly confused but  alert and  conversant. She was in no acute distress.  SKIN:  Warm and dry without rash.  VITAL SIGNS: Temperature 101.3, pulse rate 110, respiratory rate normal.  HEAD AND NECK:  Revealed no signs of trauma.  Pupil responses were  normal. Conjunctiva clear.  Oropharynx revealed pink mucous membranes  that appeared well hydrated. Scattered dentition was absent. Neck was  supple.  The patient had bilateral bruits versus transmitted murmur.  CHEST:  Clear.  CARDIOVASCULAR:  Exam revealed a grade 3-4/6 harsh systolic murmur  loudest at the aortic area.  CHEST:  The right breast was unremarkable.  Left mastectomy site was  clean.  Axilla clear.  ABDOMEN:  Soft and nontender.  There is no organomegaly.  No bruits  noted.  She had a well-healed lower midline scar and a right upper  quadrant scar.  EXTREMITIES:  Revealed no edema. The patient full range of motion of the  hips without pain. Peripheral pulses were full except for absent right  dorsalis pedis pulse.  NEUROLOGIC:  Exam nonfocal.   LABORATORY STUDIES:  Revealed  a white count of 11.4, hemoglobin 13.5,  hematocrit 39.8.  Sodium was slightly depressed at 132.  Total CK 339.  Urinalysis was negative for leukocyte esterase   IMPRESSION:  1. Acute febrile illness, probable early urosepsis.  2. Profound weakness secondary to #1.  3. Systolic murmur.  4. Hypertension,  5. Elevated CPK probably secondary to mild rhabdomyolysis.   DISPOSITION:  Will admit the patient to hospital.  Blood cultures have  been obtained.  Will continue Rocephin 1 gram every 24 hours. Will  consider a 2-D echocardiogram. Office notes will be obtained for review.      Gordy Savers, MD  Electronically Signed     PFK/MEDQ  D:  10/06/2006  T:  10/08/2006  Job:  [id number]

## 2010-06-30 NOTE — H&P (Signed)
[name], [name]             ACCOUNT NO.:  [id number]   MEDICAL RECORD NO.:  [id number]          PATIENT TYPE:  INP   LOCATION:  6715                         FACILITY:  [facility]   PHYSICIAN:  Thora Lance, M.D.  DATE OF BIRTH:  Sep 28, 1916   DATE OF ADMISSION:  11/11/2006  DATE OF DISCHARGE:                              HISTORY & PHYSICAL   CHIEF COMPLAINT:  Not eating.   HISTORY OF PRESENT ILLNESS:  This is an 77 year old white female who had  been admitted on October 06, 2006 through October 09, 2006 with a urinary  tract infection.  She had presented with fever and weakness.  Since  going home, she has had a very poor appetite with has accelerated in the  last 2 weeks.  She had lost about 9 pounds.  She will only eat small  amounts of food and then spits some of it out.  She has frequent dry  heaves but has not vomited.  If she lies down, she tends to have dry  heaves.  She has lots of belching and gas.  Her daughter says she takes  liquids okay but has more trouble with solids.  She denies any signs of  aspiration.   PAST MEDICAL HISTORY:  1. Hypertension.  2. Mild dementia.  3. UTI in August of 2008.  4. Left breast cancer.   PAST SURGICAL HISTORY:  1. Left mastectomy.  2. TAH.   ALLERGIES:  NONE.   MEDICATIONS:  Norvasc 5 mg a day.   FAMILY HISTORY:  Noncontributory.   SOCIAL HISTORY:  Husband deceased, lives with daughter.  No alcohol, no  tobacco.   REVIEW OF SYSTEMS:  Her review of systems is otherwise negative.   PHYSICAL EXAMINATION:  GENERAL:  Thin, elderly female.  VITAL SIGNS:  Pending.  HEENT:  Pupils equal and respond to light.  Anicteric.  Oropharynx shows  moist mucous membranes.  No masses.  NECK:  Supple.  No lymphadenopathy, no masses.  LUNGS:  Clear.  HEART:  Regular rate and rhythm with a 3/6 systolic murmur across the  precordium.  ABDOMEN:  Soft, nontender, normal bowel sounds.  No masses or  hepatosplenomegaly.  EXTREMITIES:  No  edema.  NEUROLOGIC:  Nonfocal.   LABORATORY DATA:  This is pending.   ASSESSMENT:  1. Decreased oral intake.  Question anorexia versus a primary      gastrointestinal problem of the esophagus or stomach.  2. Weight loss.  3. Mild dementia.  4. Hypertension.  5. Systolic murmur, apparently has had an echocardiogram the past.   PLAN:  Admit.  IV fluids.  Lab workup.  Nutrition consult.  GI consult  for possible endoscopy versus barium swallow.  Consider Megace.           ______________________________  Thora Lance, M.D.     JJG/MEDQ  D:  11/11/2006  T:  11/12/2006  Job:  [id number]   cc:   Dr. __________

## 2010-06-30 NOTE — Consult Note (Signed)
NAMEKANOE, WANNER             ACCOUNT NO.:  [id number]   MEDICAL RECORD NO.:  [id number]          PATIENT TYPE:  INP   LOCATION:  5114                         FACILITY:  [facility]   PHYSICIAN:  Wilson Singer, M.D.DATE OF BIRTH:  03-02-1916   DATE OF CONSULTATION:  10/30/2007  DATE OF DISCHARGE:                                 CONSULTATION   Consult is for goals of care.   REQUESTING PHYSICIAN:  Dr. Merlinda Frederick.   This nurse practitioner, Lorinda Creed reviewed medical records, received  report from team, assessed the patient, and then met at the patient's  bedside with her daughter and son to discuss diagnosis, prognosis, goals  of care, end-of-life wishes, disposition, and options.  The family  goals:  1. DNR/DNI.  2. No artificial feedings.  3. Meet with social worker to discuss SNIF options.  4. Remeet with nurse practitioner tomorrow at 12 o'clock to regoal.      Family will take the time in the next 24 hours to process and      discuss today's goals of care meeting.   A detailed discussion was held related to advanced directives concepts  related to code status, artificial feeding and hydration, continued  antibiotic use, continued rehospitalization was held.  At discussion  outlining an aggressive medical intervention approach to prolong life  versus a comfort, palliative approach to promote comfort and quality was  held.  A Hard Choices booklet was given to the family to review and they  are encouraged to call with questions or concerns.  Palliative Care will  continue to support holistically.   CHIEF COMPLAINT:  Dysphagia.   HISTORY OF PRESENT ILLNESS:  This is an 77 year old white female with a  history of end-stage dementia.  She lives at home and is cared for by  her daughter.  She has a complex past medical history.  She was admitted  to the emergency room on October 26, 2007 with complaints of  generalized fatigue and failure to thrive for approximately 2  weeks.  It  was becoming more and more difficult for the patient to eat or drink.  She was worked up in the emergency room and chest x-ray revealed a lower  lobe disease process consistent with pneumonia.  The family was grateful  for the opportunity to discuss concept related to advance directives and  process concerns related to this complex medical decision-making  process.  A Hard Choices booklet was left with family to review and they  are encouraged to call with questions or concerns.  Palliative Care will  continue to support holistically.   PAST MEDICAL HISTORY:  1. Hypertension.  2. Alzheimer dementia.  3. History of left breast cancer status post left-sided mastectomy      with chemotherapy.  4. Hiatal hernia.  5. Left hemispheric CVA.  6. Dysphagia.   ALLERGIES:  SULFA.   MEDICATIONS:  Reviewed.  Please see MAR for active list of meds.   FAMILY HISTORY:  Reviewed and noncontributory at this time.   SOCIAL HISTORY:  The patient lives in [place] with her daughter.  She  is  a widow.  She has 1 son and 1 daughter.  Alcohol, tobacco, and  illicit drug use is denied.   REVIEW OF SYSTEMS:  As per mentioned in HPI.  The patient is incontinent  of urine and stool.  The patient has had decreased appetite, which has  continued to diminish over the last several months.  She is  nonambulatory.  Family does report some skin breakdown on the sacral  area.   PHYSICAL EXAMINATION:  VITAL SIGNS:  121/77, temperature 98.8, pulse 99,  respirations 18, O2 sats on room air 95%.  GENERAL:  This is a well-nourished white female who is unable to follow  commands and somewhat lethargic.  HEENT:  Head is normocephalic, atraumatic.  Eyes, pupils are equal and  reactive to light.  Mouth, mucous membranes are dry.  Poor dentition.  LUNGS:  Decreased in the bases, but clear to auscultation.  HEART:  Regular rate and rhythm.  Positive murmur noted.  ABDOMEN:  Soft, nontender, positive bowel  sounds.  EXTREMITIES:  Bilateral lower extremities with trace edema.  No cyanosis  or erythema noted.  NEUROLOGIC:  The patient is confused and lethargic.  SKIN:  Decreased turgor.  No bruising noted.   Lab data reviewed from October 30, 2007:  Sodium 140, potassium 3.9,  chloride 109, CO2 of 25, glucose 97, BUN 3, creatinine 0.84, calcium  8.6, total protein 5.9, albumin in blood 2.4.  WBCs 8.9, RBCs 3.98,  hemoglobin 13.1, hematocrit 39.2, platelet count 163.   Total time spent on the unit was 90 minutes.  Time in was 10:30 a.m.  Time out was 12 noon.  Counseling and coordination of care composed 50%  or greater portion of this interaction.  Diagnosis and prognosis was  clarified.  The concept of hospice and palliative care was reviewed.  The values and goals important to the patient and family were elicited.  The team approach to our service was offered.  Palliative Care will  continue to support holistically and nurse practitioner will remeet with  the family tomorrow at 12 o'clock.  They are encouraged to call with questions or concerns.      Herbert Pun, NP      Wilson Singer, M.D.  Electronically Signed    MCL/MEDQ  D:  10/30/2007  T:  10/31/2007  Job:  [id number]   cc:   [facility]  Dr. Merlinda Frederick

## 2010-06-30 NOTE — Discharge Summary (Signed)
NAMEMAYTE, DIERS             ACCOUNT NO.:  [id number]   MEDICAL RECORD NO.:  [id number]          PATIENT TYPE:  INP   LOCATION:  4702                         FACILITY:  [facility]   PHYSICIAN:  Alvester Morin, M.D.  DATE OF BIRTH:  [birth year]   DATE OF ADMISSION:  01/26/2007  DATE OF DISCHARGE:  02/01/2007                               DISCHARGE SUMMARY   CHIEF COMPLAINT:  Shortness of breath secondary to pneumonia.   DISCHARGE DIAGNOSES:  1. Right lower lobe pneumonia.  2. Failure to thrive.  3. Dysphagia.  4. Hypertension.  5. History of ulcer.  6. Punctate posterior frontal and parietal infarcts.  7. History of hiatal hernia.  8. Mild dementia.  9. Left-sided breast cancer status post mastectomy and chemotherapy in      1998.   DISCHARGE MEDICATIONS:  1. Norvasc 5 mg daily.  2. Aricept 5 mg daily.  3. Mirtazapine 15 mg 1/2 tablet at bedtime.  4. Protonix 40 mg daily.  5. Zocor 40 mg daily.  6. Cleocin 300 mg 3 times per day for 7 days.  7. Aspirin 325 mg daily.  8. Colace 100 mg daily.   DISPOSITION AND FOLLOWUP:  The patient will be discharged to [facility]  for 1 to 2 weeks of rehabilitation with the goal of patient returning  home to live with her daughter who can provide care for her. The patient  just needs some improved strength to help with ADLs and things such as  transitioning, but the goal is to get the patient home, not keep her in  the nursing home. Once the patient arrives home, she will also follow up  with her primary doctor, Dr. Leanor Rubenstein, number [phone]. Daughter is to call  for appointment once the patient is home, and Dr. Leanor Rubenstein will see her in  her own home.   STUDIES:  1. CT of the head without contrast January 28, 2007. Impression:      Atrophy and small-vessel disease without apparent acute      intracranial finding.  2. MRI brain January 28, 2007. Impression:  Atrophy and chronic small-      vessel disease. Two punctate acute strokes in the  left hemispheric      white matter.  3. Carotid Dopplers January 30, 2007. Impression:  No carotid      stenosis.   CULTURES:  Blood cultures January 26, 2007:  No growth x2. Sputum  culture:  Attempted to obtain but was unable.   CONSULTS:  Neurology, see by Dr. Nash Shearer.   BRIEF HISTORY AND PHYSICAL:  This is an 77 year old white female with  past medical history of hypertension, multiple UTIs, failure to thrive  and history of left-sided breast cancer. She presents to the ED after  her daughter noticed approximately three weeks ago she had decreased  appetite and was not acting normal. At this time, the daughter collected  urine and sent to her primary care doctor, and the patient was found to  have a urinary tract infection. Initially, the patient was started on  Cipro and treated for 7 days. This occurred from December  1 to December  8. The patient had some mild improvement after finishing antibiotic  course but never returned to baseline. Shortly thereafter, the patient  began to decline again. She saw her PCP. This occurred 2 days prior to  admission, and a chest x-ray was obtained. The chest x-ray showed a  right lower lobe pneumonia. The patient was started on Levaquin. The  patient did not improve after two days of Levaquin. The evening prior to  admission, the patient had increased shortness of breath and was unable  to ambulate, although at baseline was able to ambulate around the home  with only minor assistance. The patient denied fever, chills, nausea,  vomiting, diarrhea, productive cough .  No history of  COPD, at  baseline, the patient is on home O2. The patient does not use any  inhalers. The patient does not smoke. She does have a history of  dysphagia. Because of this two-day decline on antibiotics, it was  decided with the patient's primary care physician that she should come  to the hospital for admission and workup of this pneumonia.   ALLERGIES:  No known  drug allergies.   PAST MEDICAL HISTORY:  1. Hypertension.  2. Anorexia.  3. Weight loss.  4. Failure to thrive.  5. Dysphagia.  6. Mild gastritis.  7. Hiatal hernia.  8. Swallow duodenal ulcer.  9. Mild dementia.  10.Hypertension.  11.Breast cancer, left side, status post mastectomy and chemotherapy      in 1998.   MEDICATIONS:  On admission:  1. Norvasc 5 mg daily. The patient had been off 2 days prior to      admission.  2. Aricept 5 mg every night.  3. Protonix 40 mg daily.  4. Megace 400 mg b.i.d.  5. MiraLax 17 mg every night p.r.n.  6. Resource 240 mL b.i.d.  7. Remeron 7.5 mg every night.   The patient does not smoke, does not use alcohol or any other drugs, and  lives at home with her daughter.   FAMILY HISTORY:  Noncontributory.   REVIEW OF SYSTEMS:  Positive for sore throat. Negative otherwise unless  noted in HPI.   ADMISSION VITALS:  Temperature 97.7, blood pressure 129/64, pulse 84,  respiratory rate 20, saturating 99% on 2 liters.   PHYSICAL EXAMINATION:  GENERAL:  This is a frail, elderly lady in no  acute distress. Alert and oriented x2.  EYES:  Pupils are equal and reactive to light and accommodation.  Extraocular movements intact. Anicteric. EARS, NOSE AND THROAT:  Mucous  membranes are dry. No pharyngeal erythema or exudate.  NECK:  Supple. No thyromegaly.  RESPIRATORY:  Clear to auscultation bilaterally but does have some  decreased air movement in the right lower lobe. No wheezes or crackles.  CARDIOVASCULAR:  Regular rate and rhythm with systolic ejection murmur.  Good peripheral pulses and no JVD.  She has soft, tender, suprapubic region but no distention and positive  bowel sounds.  EXTREMITIES:  No clubbing, cyanosis, or edema.  GENITOURINARY:  No costovertebral angle tenderness.  SKIN:  No rashes. Normal color. Normal turgor.  LYMPH:  No LAD.  MUSCULOSKELETAL:  Normal strength, normal sensation, normal tone. There  is some wasting.   NEUROLOGICAL:  No focal deficits.  PSYCH:  Appropriate.   ADMISSION LABORATORY DATA:  CBC:  White count 11.3, hemoglobin 13.3,  platelets 248 with an ANC of 8.1. Electrolytes:  Sodium 136, potassium  3.9, chloride 102, bicarb 24, BUN 12, creatinine 1.05 and a  glucose of  87. Anion gap was 10. Bilirubin 1.0, alkaline phosphatase 82, AST 23,  ALT 10, protein 6.3, albumin 2.6, calcium 8.7. Chest x-ray showed a new  right lower lobe effusion with some air space disease. Urinalysis  checked was negative. Point of care cardiac enzymes were negative.   HOSPITAL COURSE BY PROBLEM:  1. Right lower lobe pneumonia. The patient was initially admitted for      right lower lobe pneumonia seen on chest x-ray two days prior to      admission. She was started on Levaquin and did not improve with      three days of treatment. The patient's physical exam had poor air      movement on the right. She had also had a history of treatment with      Cipro approximately two weeks prior to admission. She had worsening      shortness of breath and was requiring 2 liters of oxygen but      maintaining her saturations. She was initially started on Rocephin      and azithromycin, and sputum cultures were attempted to be      obtained, respiratory tried to induce,  but the patient was unable      to produce any sputum. Over the course of her hospitalization, the      patient remained afebrile, but her white count began to increase      from 10 to 11 and then up to 12.  Antibiotics were changed to      Avelox, and she was continued on  Avelox for 2 days. The patient's      white count continued to stay between 10 and 12, so we decided to      change the antibiotic again to clindamycin which we felt would      better cover gram positive but still was missing pseudomonas.      However, with her clinical improvement and her being afebrile, we      felt this was a good antibiotic choice. The patient was continued      on  this antibiotic for three days prior to discharge and on the day      of discharge had clinically improved greatly. However, her white      count remained variable between 10 and 12, and this continued to be      the case the day of discharge. However, the patient did remain      afebrile. The patient, however, was not complaining of shortness of      breath and thought her breathing was improved. Daughter also      present and felt that her mother was back at her preadmission      baseline.  2. Acute punctate strokes in the left hemispheric white matter. The      patient's mental status was somewhat decreased, and she was not      very arousable on the third day of admission. Therefore, a CT was      checked which was unremarkable. This was then followed up with a      MRI when the mental status continued to not improve, and this MRI      showed no acute strokes. Neurology was consulted on the patient,      and Dr. Nash Shearer from [facility] saw the      patient. He recommended a speech evaluation and physical therapy      and  occupational therapy, but there was no treatment which needed      to be pursued with currently. He also recommended a stroke workup      with carotid Dopplers which were performed. Carotid Dopplers were      performed on January 30, 2007 and were negative. We continued the      patient on aspirin and on DVT prophylaxis. She was on telemetry and      did not have any signs of atrial fibrillation. We felt that these      strokes, although acute, were not really responsible due to their      location and were not really responsible for her altered mental      status. In speaking with the family, they agreed that this waxing      and waning of mental status between confused and alert and oriented      x2 was her normal baseline and had been this way for multiple      years.  3. Hypertension. The patient's blood pressure medicine was held prior       to discharge, and her blood pressure ranged from the 110s to the      140s. The patient was restarted on the Norvasc on the day of      discharge and appeared to be tolerating well. Her blood pressure      continued to be followed at [facility] as an outpatient.  4. Failure to thrive/decreased appetite. The patient was on Resource      and Megace on admission and continued to have decreased appetite.      We felt this was likely due to her waxing and waning of mental      status as well as maybe some sundowning and disorientation during      her hospital stay. The patient would, however, eat when being fed.      This will likely continue to be a problem, and she will need close      observation at home to ensure that she does get p.o. intake. On the      day of discharge, we did stop the Megace, however, with the      increased white count. We did not feel that the Megace was likely      related but nevertheless stopped and will see if the white count      will improve off the Megace.  5. Dysphagia. The patient was noted to have some dysphagia, especially      with thin liquids. A bedside swallowing evaluation by nursing was      done initially and patient passed. However, after these mild      strokes, the swallowing seemed to get a little bit worse with the      thin liquids. Although we are still unclear whether these strokes      would explain this dysphagia, we thickened her thin clear liquids      to honey thick, and she seemed to be tolerating those well without      coughing or fear for aspiration. She tolerated a soft diet as well      without coughing or aspiration.  6. Deconditioning. The patient was fairly deconditioned and needs      assistance ambulating and with transfers. We felt that in speaking      with the family that their wishes were to have the patient back      home with them  and be cared for there and not placed in any kind of      nursing home. After a physical  therapy evaluation, however, the      family seemed opened to one to two weeks of rehabilitation to help      with transitions and ambulation so she could be cared for better at      home. The patient was transferred to [facility] skilled nursing      facility where she would get this rehabilitation.   DISCHARGE LABORATORY DATA AND VITALS:  Temperature 97.8, pulse 104,  respiratory rate 20, blood pressure 139/63, saturating 97% on 2 liters.   Labs:  CBC - white count 12.1, hemoglobin 13.8, platelets 375.  Electrolytes:  Sodium 138, potassium 3.7, chloride 106, bicarb 26,  glucose 96, BUN 5, creatinine 0.82, calcium 8.5.      Tacey Ruiz, MD  Electronically Signed      Alvester Morin, M.D.  Electronically Signed    JP/MEDQ  D:  02/01/2007  T:  02/01/2007  Job:  [id number]   cc:   Leanor Rubenstein, M.D.

## 2010-06-30 NOTE — Discharge Summary (Signed)
[name], [name]             ACCOUNT NO.:  [id number]   MEDICAL RECORD NO.:  [id number]          PATIENT TYPE:  INP   LOCATION:  5114                         FACILITY:  [facility]   PHYSICIAN:  Hillery Aldo, M.D.   DATE OF BIRTH:  1916/05/23   DATE OF ADMISSION:  10/25/2007  DATE OF DISCHARGE:  11/01/2007                               DISCHARGE SUMMARY   PRIMARY CARE PHYSICIAN:  Johney Maine, MD   ONCOLOGIST:  Genene Churn. Cyndie Chime, MD   DISCHARGE DIAGNOSES:  1. Aspiration pneumonia.  2. Failure to thrive.  3. Alzheimer dementia.  4. Dehydration.  5. History of hypertension.  6. History of breast cancer with local recurrence, on tamoxifen.  7. History of cerebrovascular accident.  8. Cerebrovascular disease.  9. Dysphasia  10.Hiatal hernia.   DISCHARGE MEDICATIONS:  1. Tamoxifen 20 mg daily.  2. Thickener to thicken liquids to honey thick consistency as needed.  3. Nystatin cream to rash b.i.d. p.r.n.   CONSULTATIONS:  1. Genene Churn. Cyndie Chime, MD of Oncology.  2. Wilson Singer, MD of Palliative Care Medicine.   BRIEF ADMISSION HISTORY OF PRESENT ILLNESS:  The patient is a very  pleasant 77 year old female with advanced dementia, who presented to the  hospital with weakness and generalized failure to thrive.  The patient  herself was otherwise without complaint.  Upon initial evaluation in  emergency department, she was found to have evidence of a right lower  lobe pneumonia and was therefore admitted for further evaluation and  treatment.  For full details, please see the dictated report done by Dr.  Merlinda Frederick.   PROCEDURES AND DIAGNOSTIC STUDIES:  1. Chest x-ray on October 25, 2007, showed posterior lower lobe      airspace disease/pneumonia.  Stable scattered areas of bony      sclerosis, likely related to metastatic disease, cardiomegaly.  2. CT scan of the head on October 25, 2007, showed no acute      intracranial findings.  Stable cerebral atrophy and  chronic      microvascular white matter disease.  3. Swallowing function study on October 27, 2007, showed mild-to-      moderate oropharyngeal dysphagia marked by premature spillage of      materials into the pharynx prior to swallow trigger, due to      decrease sensation, oral control, and cognitive awareness.      Recommendations were dysphagia 2 diet with honey thick liquids.      Crush medications and give the purees.  Full supervision.  4. Chest x-ray on October 30, 2007, showed stable cardiomegaly with      improved aeration of left lower lobe and residual right lower lobe      atelectasis versus pneumonia.  Stable small effusions.   DISCHARGE LABORATORY VALUES:  Chemistries were completely normal with  the exception of slightly low BUN at 3.  CBC was completely normal.  CA-  27.29 was high at 103.   HOSPITAL COURSE:  1. Right lower lobe aspiration pneumonia:  The patient was admitted      and empirically put on Zosyn due to the  high clinical suspicion for      recurrent aspiration pneumonia.  She has completed 4 days of      therapy with IV Zosyn and was transitioned over to p.o. Augmentin.      She will complete her course of therapy today and family has      elected for no further antibiotics should she have a recurrent      pneumonia.  2. Failure to thrive/Alzheimer dementia:  The patient has had a steady      progressive downhill course.  Because of this, Palliative Care      consultation was requested and kindly provided by Dr. Karilyn Cota to      clarify end-of-life goals.  Family goals were to maintain the      patient DNR status, to avoid any artificial feeding or hydration,      and to avoid future antibiotic use.  They wished to take her home      and she will be discharged to home with hospice care.  A hospital      bed has been ordered for the patient and hospice will follow her at      discharge.  3. Dehydration:  The patient was rehydrated.  4. History of  hypertension:  The patient's blood pressure has remained      normotensive throughout her hospital stay despite being taken off      her antihypertensive medications.  We will not resume these at      discharge.  5. Breast cancer:  The patient was put back on tamoxifen for local      recurrence of chest wall breast cancer.  This has regressed with      tamoxifen therapy.  We will continue tamoxifen and she can follow      up with Dr. Cyndie Chime as needed as an outpatient.  6. Cerebrovascular disease with history of stroke and ongoing      dysphagia:  The patient was seen in consultation with speech      therapist and recommendations are as noted above.   DISPOSITION:  The patient will be discharged today or early tomorrow  depending on whether or not all hospital equipment has been  satisfactorily set up at the patient's daughter's home.  She can follow  up Dr. Karn Pickler as needed.  Again, hospice will follow her in the  outpatient environment.      Hillery Aldo, M.D.  Electronically Signed     CR/MEDQ  D:  11/01/2007  T:  11/02/2007  Job:  [id number]   cc:   Johney Maine, M.D.  Genene Churn. Cyndie Chime, M.D.

## 2010-11-18 LAB — COMPREHENSIVE METABOLIC PANEL
ALT: 11
AST: 26
Alkaline Phosphatase: 38 — ABNORMAL LOW
CO2: 22
Calcium: 8.5
Chloride: 107
GFR calc Af Amer: >60
GFR calc non Af Amer: >60
Potassium: 4.1
Sodium: 138

## 2010-11-18 LAB — CBC
HCT: 42
Hemoglobin: 10.2 — ABNORMAL LOW
Hemoglobin: 12.6
Hemoglobin: 13.1
Hemoglobin: 14.1
MCHC: 32.6
MCHC: 33.3
MCHC: 33.5
MCHC: 34.3
Platelets: 148 — ABNORMAL LOW
Platelets: 152
RBC: 3.98
RBC: 4.28
RDW: 14.3
RDW: 14.6
RDW: 14.9
WBC: 7.8
WBC: 8.9
WBC: 9.2

## 2010-11-18 LAB — POCT I-STAT, CHEM 8
BUN: 24 — ABNORMAL HIGH
Glucose, Bld: 128 — ABNORMAL HIGH
Potassium: 4.3
TCO2: 28

## 2010-11-18 LAB — URINALYSIS, ROUTINE W REFLEX MICROSCOPIC
Bilirubin Urine: NEGATIVE
Nitrite: NEGATIVE
Urobilinogen, UA: 0.2
pH: 7.5

## 2010-11-18 LAB — BASIC METABOLIC PANEL
BUN: 3 — ABNORMAL LOW
BUN: 8
CO2: 26
Calcium: 8.3 — ABNORMAL LOW
Calcium: 8.4
Calcium: 8.6
Chloride: 109
Creatinine, Ser: 0.84
Creatinine, Ser: 0.85
Creatinine, Ser: 0.89
GFR calc Af Amer: >60
GFR calc non Af Amer: 60 — ABNORMAL LOW
GFR calc non Af Amer: >60
Glucose, Bld: 112 — ABNORMAL HIGH
Glucose, Bld: 120 — ABNORMAL HIGH
Sodium: 140

## 2010-11-18 LAB — POCT CARDIAC MARKERS
CKMB, poc: 1.9
Myoglobin, poc: 102

## 2010-11-18 LAB — TSH: TSH: 2.132

## 2010-11-18 LAB — URINE MICROSCOPIC-ADD ON

## 2010-11-18 LAB — CANCER ANTIGEN 27.29: CA 27.29: 103 — ABNORMAL HIGH

## 2010-11-18 LAB — GLUCOSE, CAPILLARY: Glucose-Capillary: 110 — ABNORMAL HIGH

## 2010-11-18 LAB — DIFFERENTIAL
Eosinophils Absolute: 0.3
Lymphocytes Relative: 27
Neutro Abs: 5.5
Neutrophils Relative %: 60

## 2010-11-18 LAB — FOLATE: Folate: >20

## 2010-11-20 LAB — BASIC METABOLIC PANEL
BUN: 5 — ABNORMAL LOW
CO2: 24
Calcium: 8.3 — ABNORMAL LOW
Calcium: 8.5
Creatinine, Ser: 0.78
GFR calc Af Amer: >60
GFR calc non Af Amer: >60
Glucose, Bld: 87
Glucose, Bld: 96
Potassium: 3.7

## 2010-11-20 LAB — CBC
HCT: 36.7
HCT: 41.3
Hemoglobin: 12.7
MCHC: 33.5
MCHC: 34.6
MCV: 91.8
Platelets: 375
RBC: 3.99
RBC: 4.5
RDW: 14.2
RDW: 14.4

## 2010-11-20 LAB — LIPID PANEL
Cholesterol: 113
LDL Cholesterol: 83

## 2010-11-20 LAB — DIFFERENTIAL
Basophils Absolute: 0
Basophils Relative: 0
Eosinophils Absolute: 0 — ABNORMAL LOW
Eosinophils Relative: 2
Lymphocytes Relative: 17
Monocytes Absolute: 0.8
Monocytes Relative: 8

## 2010-11-23 LAB — BASIC METABOLIC PANEL
BUN: 13
BUN: 8
BUN: 9
CO2: 22
Calcium: 8.5
Calcium: 8.6
Calcium: 8.7
Chloride: 102
Chloride: 107
Creatinine, Ser: 0.94
Creatinine, Ser: 1.06
GFR calc Af Amer: 59 — ABNORMAL LOW
GFR calc Af Amer: >60
GFR calc non Af Amer: 49 — ABNORMAL LOW
GFR calc non Af Amer: 56 — ABNORMAL LOW
GFR calc non Af Amer: >60
Glucose, Bld: 72
Glucose, Bld: 85
Potassium: 4.8
Sodium: 138

## 2010-11-23 LAB — DIFFERENTIAL
Basophils Absolute: 0
Eosinophils Relative: 0
Eosinophils Relative: 1
Lymphocytes Relative: 13
Lymphocytes Relative: 15
Lymphs Abs: 1.3
Lymphs Abs: 1.7
Monocytes Absolute: 1
Monocytes Absolute: 1.5 — ABNORMAL HIGH
Monocytes Relative: 10
Monocytes Relative: 13 — ABNORMAL HIGH
Neutro Abs: 8 — ABNORMAL HIGH

## 2010-11-23 LAB — COMPREHENSIVE METABOLIC PANEL
ALT: 10
AST: 20
AST: 23
Albumin: 2.5 — ABNORMAL LOW
Alkaline Phosphatase: 82
BUN: 8
CO2: 24
Calcium: 8.7
Calcium: 8.7
Chloride: 108
Creatinine, Ser: 1.09
GFR calc Af Amer: 57 — ABNORMAL LOW
GFR calc Af Amer: 60 — ABNORMAL LOW
Glucose, Bld: 87
Potassium: 3.9
Sodium: 136
Total Protein: 6.3
Total Protein: 6.3

## 2010-11-23 LAB — URINALYSIS, ROUTINE W REFLEX MICROSCOPIC
Bilirubin Urine: NEGATIVE
Hgb urine dipstick: NEGATIVE
Nitrite: NEGATIVE
Specific Gravity, Urine: 1.011
pH: 7

## 2010-11-23 LAB — POCT CARDIAC MARKERS
CKMB, poc: 1.2
Troponin i, poc: <0.05

## 2010-11-23 LAB — CBC
HCT: 38.2
Hemoglobin: 13.2
Hemoglobin: 13.5
MCHC: 33.4
MCHC: 33.6
MCV: 92.3
MCV: 94
MCV: 94.7
Platelets: 210
Platelets: 223
Platelets: 261
RBC: 4.14
RBC: 4.17
RDW: 14.7
RDW: 14.8
RDW: 15.2
WBC: 10.5
WBC: 11.3 — ABNORMAL HIGH
WBC: 12.6 — ABNORMAL HIGH

## 2010-11-23 LAB — CULTURE, BLOOD (ROUTINE X 2): Culture: NO GROWTH

## 2010-11-23 LAB — CK TOTAL AND CKMB (NOT AT ARMC)
CK, MB: 1.8
CK, MB: 1.9
Total CK: 96

## 2010-11-23 LAB — TROPONIN I
Troponin I: 0.05
Troponin I: 0.05

## 2010-11-23 LAB — TSH: TSH: 1.3

## 2010-11-23 LAB — MAGNESIUM: Magnesium: 2.1

## 2010-11-26 LAB — COMPREHENSIVE METABOLIC PANEL
ALT: 11
AST: 26
Albumin: 3.3 — ABNORMAL LOW
Alkaline Phosphatase: 135 — ABNORMAL HIGH
BUN: 11
CO2: 30
Calcium: 9.3
Chloride: 100
Creatinine, Ser: 0.88
GFR calc Af Amer: >60
GFR calc non Af Amer: >60
Glucose, Bld: 117 — ABNORMAL HIGH
Potassium: 3.5
Sodium: 137
Total Bilirubin: 0.7
Total Protein: 7.4

## 2010-11-26 LAB — URINALYSIS, ROUTINE W REFLEX MICROSCOPIC
Bilirubin Urine: NEGATIVE
Bilirubin Urine: NEGATIVE
Glucose, UA: NEGATIVE
Glucose, UA: NEGATIVE
Hgb urine dipstick: NEGATIVE
Hgb urine dipstick: NEGATIVE
Ketones, ur: NEGATIVE
Ketones, ur: NEGATIVE
Nitrite: NEGATIVE
Nitrite: NEGATIVE
Protein, ur: NEGATIVE
Protein, ur: NEGATIVE
Specific Gravity, Urine: 1.007
Specific Gravity, Urine: 1.008
Urobilinogen, UA: 1
Urobilinogen, UA: 1
pH: 6
pH: 7

## 2010-11-26 LAB — OCCULT BLOOD X 1 CARD TO LAB, STOOL: Fecal Occult Bld: NEGATIVE

## 2010-11-26 LAB — PROTIME-INR
INR: 1
Prothrombin Time: 13.6

## 2010-11-26 LAB — CBC
HCT: 42.5
Hemoglobin: 14.2
MCHC: 33.4
MCV: 92
Platelets: 294
RBC: 4.62
RDW: 14.5 — ABNORMAL HIGH
WBC: 10.5

## 2010-11-26 LAB — URINE CULTURE
Colony Count: NO GROWTH
Culture: NO GROWTH

## 2010-11-26 LAB — BASIC METABOLIC PANEL
BUN: 9
CO2: 28
Calcium: 8.7
Chloride: 106
Creatinine, Ser: 0.88
GFR calc Af Amer: >60
GFR calc non Af Amer: >60
Glucose, Bld: 127 — ABNORMAL HIGH
Potassium: 4.1
Sodium: 138

## 2010-11-26 LAB — HEPATIC FUNCTION PANEL
ALT: 10
AST: 26
Albumin: 2.6 — ABNORMAL LOW
Alkaline Phosphatase: 104
Bilirubin, Direct: 0.2
Indirect Bilirubin: 0.6
Total Bilirubin: 0.8
Total Protein: 6.1

## 2010-11-26 LAB — TSH: TSH: 0.928

## 2010-11-26 LAB — PREALBUMIN: Prealbumin: 17 — ABNORMAL LOW

## 2010-11-26 LAB — CLOTEST (H. PYLORI), BIOPSY: Helicobacter screen: NEGATIVE

## 2010-11-26 LAB — APTT: aPTT: 31

## 2010-11-27 LAB — CULTURE, BLOOD (ROUTINE X 2): Culture: NO GROWTH

## 2010-11-27 LAB — CBC
HCT: 38.9
HCT: 39.8
Hemoglobin: 13.3
MCHC: 34
MCV: 92.5
Platelets: 190
Platelets: 207
RDW: 14.3 — ABNORMAL HIGH
WBC: 11.4 — ABNORMAL HIGH

## 2010-11-27 LAB — URINALYSIS, ROUTINE W REFLEX MICROSCOPIC
Nitrite: NEGATIVE
Specific Gravity, Urine: 1.012
pH: 6.5

## 2010-11-27 LAB — BASIC METABOLIC PANEL
BUN: 10
BUN: 12
CO2: 25
Chloride: 106
GFR calc non Af Amer: 49 — ABNORMAL LOW
Glucose, Bld: 95
Potassium: 3.7
Potassium: 4
Sodium: 132 — ABNORMAL LOW

## 2010-11-27 LAB — DIFFERENTIAL
Eosinophils Relative: 1
Lymphocytes Relative: 13
Lymphs Abs: 1.5
Neutro Abs: 8.6 — ABNORMAL HIGH

## 2010-11-27 LAB — URINE CULTURE

## 2017-03-11 ENCOUNTER — Telehealth: Payer: Self-pay

## 2017-03-11 ENCOUNTER — Other Ambulatory Visit: Payer: Self-pay | Admitting: Internal Medicine

## 2017-03-11 NOTE — Telephone Encounter (Signed)
Patient's son called [facility]- informed they agreed patient needs to see neurology for memory issues. Needs referral.   Please Advise.

## 2017-03-11 NOTE — Telephone Encounter (Signed)
Incorrect patient - error.
